# Patient Record
Sex: Male | Born: 1985 | Hispanic: Yes | Smoking: Never smoker
Health system: Southern US, Community
[De-identification: ages and names within clinical notes are randomized; demographics above are authoritative.]

---

## 2020-07-07 ENCOUNTER — Emergency Department (HOSPITAL_COMMUNITY)
Admission: EM | Admit: 2020-07-07 | Discharge: 2020-07-07 | Disposition: A | Discharge status: Home / Self Care | Payer: Self-pay | Attending: Emergency Medicine | Admitting: Emergency Medicine

## 2020-07-07 ENCOUNTER — Emergency Department (HOSPITAL_COMMUNITY): Payer: Self-pay

## 2020-07-07 ENCOUNTER — Encounter (HOSPITAL_COMMUNITY): Payer: Self-pay | Admitting: Emergency Medicine

## 2020-07-07 ENCOUNTER — Other Ambulatory Visit: Payer: Self-pay

## 2020-07-07 DIAGNOSIS — R5383 Other fatigue: Secondary | ICD-10-CM | POA: Insufficient documentation

## 2020-07-07 DIAGNOSIS — R1013 Epigastric pain: Secondary | ICD-10-CM | POA: Insufficient documentation

## 2020-07-07 DIAGNOSIS — R051 Acute cough: Secondary | ICD-10-CM | POA: Insufficient documentation

## 2020-07-07 DIAGNOSIS — Z20822 Contact with and (suspected) exposure to covid-19: Secondary | ICD-10-CM | POA: Insufficient documentation

## 2020-07-07 DIAGNOSIS — R509 Fever, unspecified: Secondary | ICD-10-CM | POA: Insufficient documentation

## 2020-07-07 LAB — COMPREHENSIVE METABOLIC PANEL
ALT: 45 U/L — ABNORMAL HIGH (ref 0–44)
AST: 23 U/L (ref 15–41)
Albumin: 4.1 g/dL (ref 3.5–5.0)
Alkaline Phosphatase: 66 U/L (ref 38–126)
Anion gap: 12 (ref 5–15)
BUN: 16 mg/dL (ref 6–20)
CO2: 22 mmol/L (ref 22–32)
Calcium: 9.3 mg/dL (ref 8.9–10.3)
Chloride: 106 mmol/L (ref 98–111)
Creatinine, Ser: 0.83 mg/dL (ref 0.61–1.24)
GFR, Estimated: >60 mL/min (ref >60)
Glucose, Bld: 120 mg/dL — ABNORMAL HIGH (ref 70–99)
Potassium: 3.9 mmol/L (ref 3.5–5.1)
Sodium: 140 mmol/L (ref 135–145)
Total Bilirubin: 0.2 mg/dL — ABNORMAL LOW (ref 0.3–1.2)
Total Protein: 7.2 g/dL (ref 6.5–8.1)

## 2020-07-07 LAB — CBC WITH DIFFERENTIAL/PLATELET
Abs Immature Granulocytes: 0.06 10*3/uL (ref 0.00–0.07)
Basophils Absolute: 0.1 10*3/uL (ref 0.0–0.1)
Basophils Relative: 0 %
Eosinophils Absolute: 0.1 10*3/uL (ref 0.0–0.5)
Eosinophils Relative: 1 %
HCT: 47 % (ref 39.0–52.0)
Hemoglobin: 15.9 g/dL (ref 13.0–17.0)
Immature Granulocytes: 1 %
Lymphocytes Relative: 28 %
Lymphs Abs: 3.4 10*3/uL (ref 0.7–4.0)
MCH: 29.3 pg (ref 26.0–34.0)
MCHC: 33.8 g/dL (ref 30.0–36.0)
MCV: 86.6 fL (ref 80.0–100.0)
Monocytes Absolute: 0.9 10*3/uL (ref 0.1–1.0)
Monocytes Relative: 7 %
Neutro Abs: 7.6 10*3/uL (ref 1.7–7.7)
Neutrophils Relative %: 63 %
Platelets: 311 10*3/uL (ref 150–400)
RBC: 5.43 MIL/uL (ref 4.22–5.81)
RDW: 14.4 % (ref 11.5–15.5)
WBC: 12.1 10*3/uL — ABNORMAL HIGH (ref 4.0–10.5)
nRBC: 0 % (ref 0.0–0.2)

## 2020-07-07 LAB — RESPIRATORY PANEL BY RT PCR (FLU A&B, COVID)
Influenza A by PCR: NEGATIVE
Influenza B by PCR: NEGATIVE
SARS Coronavirus 2 by RT PCR: NEGATIVE

## 2020-07-07 LAB — URINALYSIS, ROUTINE W REFLEX MICROSCOPIC
Bilirubin Urine: NEGATIVE
Glucose, UA: NEGATIVE mg/dL
Hgb urine dipstick: NEGATIVE
Ketones, ur: NEGATIVE mg/dL
Leukocytes,Ua: NEGATIVE
Nitrite: NEGATIVE
Protein, ur: NEGATIVE mg/dL
Specific Gravity, Urine: 1.018 (ref 1.005–1.030)
pH: 5 (ref 5.0–8.0)

## 2020-07-07 LAB — LACTIC ACID, PLASMA
Lactic Acid, Venous: 2.3 mmol/L (ref 0.5–1.9)
Lactic Acid, Venous: 1.3 mmol/L (ref 0.5–1.9)

## 2020-07-07 LAB — LIPASE, BLOOD: Lipase: 29 U/L (ref 11–51)

## 2020-07-07 MED ORDER — SODIUM CHLORIDE 0.9 % IV BOLUS
1000.0000 mL | Freq: Once | INTRAVENOUS | Status: AC
  Administered 2020-07-07: 1000 mL via INTRAVENOUS

## 2020-07-07 MED ORDER — SODIUM CHLORIDE (PF) 0.9 % IJ SOLN
INTRAMUSCULAR | Status: AC
  Filled 2020-07-07: qty 50

## 2020-07-07 MED ORDER — IOHEXOL 300 MG/ML  SOLN
100.0000 mL | Freq: Once | INTRAMUSCULAR | Status: AC | PRN
  Administered 2020-07-07: 100 mL via INTRAVENOUS

## 2020-07-07 NOTE — ED Provider Notes (Signed)
[facility]-EMERGENCY DEPT Provider Note   CSN: [id number] Arrival date & time: 07/07/20  1791     History Chief Complaint  Patient presents with  . Cough  . Fever    Joseph Taylor is a 34 y.o. male who presents for evaluation of body aches, fatigue.  He states he has also felt like he has had fever. He has also felt like he has had fever.  He states that this been going on for about 9 days.  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  He said that he used to snort heroin.  He denied any IV drug use at that time.  He states his last IV drug use was about 6 months ago.  He states that since he got clean about a month ago, he is had trouble sleeping has had some pain in his lower back, particularly the left side and has had some pain in his upper abdomen.  He states that the pain happens both when he does not eat and when he does eat.  He has not any nausea/vomiting.  He has not had any dysuria or hematuria.  He states that the pain in his back is worse when he moves.  He states he has not measured a fever over the last week but states he has felt like he has had a fever.  He states he went to an ER in [place] and that he was evaluated there.  He does not know exactly what they did for him.  He states he was discharged home with some medications but does not know what it was.  He states that he did not take them. Denies fevers, weight loss, numbness/weakness of upper and lower extremities, bowel/bladder incontinence, saddle anesthesia, history of back surgery.   The history is provided by the patient.       History reviewed. No pertinent past medical history.  There are no problems to display for this patient.   History reviewed. No pertinent surgical history.     History reviewed. No pertinent family history.  Social History   Tobacco Use  . Smoking status: Never Smoker  . Smokeless tobacco: Never Used  Vaping Use  . Vaping Use: Never  used  Substance Use Topics  . Alcohol use: Not Currently  . Drug use: Not Currently    Home Medications Prior to Admission medications   Not on File    Allergies    Patient has no known allergies.  Review of Systems   Review of Systems  Constitutional: Positive for fatigue. Negative for fever.  Respiratory: Negative for cough and shortness of breath.   Cardiovascular: Negative for chest pain.  Gastrointestinal: Positive for abdominal pain. Negative for nausea and vomiting.  Genitourinary: Negative for dysuria and hematuria.  Musculoskeletal: Positive for back pain.  Neurological: Negative for headaches.  All other systems reviewed and are negative.   Physical Exam Updated Vital Signs BP 123/76 (BP Location: Left Arm)   Pulse 97   Temp 99.1 F (37.3 C) (Rectal)   Resp 16   Ht 5\' 6"  (1.676 m)   Wt 86.2 kg   SpO2 99%   BMI 30.67 kg/m   Physical Exam Vitals and nursing note reviewed.  Constitutional:      Appearance: Normal appearance. He is well-developed.  HENT:     Head: Normocephalic and atraumatic.  Eyes:     General: Lids are normal.     Conjunctiva/sclera: Conjunctivae  normal.     Pupils: Pupils are equal, round, and reactive to light.  Cardiovascular:     Rate and Rhythm: Normal rate and regular rhythm.     Pulses: Normal pulses.     Heart sounds: Normal heart sounds. No murmur heard.  No friction rub. No gallop.   Pulmonary:     Effort: Pulmonary effort is normal.     Breath sounds: Normal breath sounds.     Comments: Lungs clear to auscultation bilaterally.  Symmetric chest rise.  No wheezing, rales, rhonchi. Abdominal:     Palpations: Abdomen is soft. Abdomen is not rigid.     Tenderness: There is abdominal tenderness in the right upper quadrant, epigastric area and left upper quadrant. There is no guarding.     Comments: Mild tenderness noted to the upper abdomen. No rigidity or guarding noted.   Musculoskeletal:        General: Normal range of  motion.     Cervical back: Full passive range of motion without pain.     Comments: No midline T or L-spine tenderness.  No deformity or crepitus noted.  He has tenderness noted to the left paraspinal muscles of the lower lumbar region.  No overlying rash.  Skin:    General: Skin is warm and dry.     Capillary Refill: Capillary refill takes less than 2 seconds.  Neurological:     Mental Status: He is alert and oriented to person, place, and time.     Comments: Follows commands, Moves all extremities  5/5 strength to BUE and BLE  Sensation intact throughout all major nerve distributions  Psychiatric:        Speech: Speech normal.     ED Results / Procedures / Treatments   Labs (all labs ordered are listed, but only abnormal results are displayed) Labs Reviewed  COMPREHENSIVE METABOLIC PANEL - Abnormal; Notable for the following components:      Result Value   Glucose, Bld 120 (*)    ALT 45 (*)    Total Bilirubin 0.2 (*)    All other components within normal limits  CBC WITH DIFFERENTIAL/PLATELET - Abnormal; Notable for the following components:   WBC 12.1 (*)    All other components within normal limits  LACTIC ACID, PLASMA - Abnormal; Notable for the following components:   Lactic Acid, Venous 2.3 (*)    All other components within normal limits  RESPIRATORY PANEL BY RT PCR (FLU A&B, COVID)  URINALYSIS, ROUTINE W REFLEX MICROSCOPIC  LACTIC ACID, PLASMA  LIPASE, BLOOD    EKG None  Radiology DG Chest 2 View  Result Date: 07/07/2020 CLINICAL DATA:  Cough. Fever and aches for 1.5 months after discontinuing heroin. EXAM: CHEST - 2 VIEW COMPARISON:  None. FINDINGS: The heart size and mediastinal contours are within normal limits. Both lungs are clear. The visualized skeletal structures are unremarkable. IMPRESSION: No active cardiopulmonary disease. Electronically Signed   By: Burman Nieves M.D.   On: 07/07/2020 20:24   CT ABDOMEN PELVIS W CONTRAST  Result Date:  07/07/2020 CLINICAL DATA:  Fever and lower abdominal pain EXAM: CT ABDOMEN AND PELVIS WITH CONTRAST TECHNIQUE: Multidetector CT imaging of the abdomen and pelvis was performed using the standard protocol following bolus administration of intravenous contrast. CONTRAST:  OMNIPAQUE IOHEXOL 300 MG/ML  SOLN COMPARISON:  None. FINDINGS: Lower chest: The visualized heart size within normal limits. No pericardial fluid/thickening. No hiatal hernia. The visualized portions of the lungs are clear. Hepatobiliary: The liver  is normal in density without focal abnormality.The main portal vein is patent. No evidence of calcified gallstones, gallbladder wall thickening or biliary dilatation. Pancreas: Unremarkable. No pancreatic ductal dilatation or surrounding inflammatory changes. Spleen: Normal in size without focal abnormality. Adrenals/Urinary Tract: Both adrenal glands appear normal. The kidneys and collecting system appear normal without evidence of urinary tract calculus or hydronephrosis. Bladder is unremarkable. Stomach/Bowel: The stomach, small bowel, and colon are normal in appearance. No inflammatory changes, wall thickening, or obstructive findings. There is a moderate amount of stool present.The appendix is normal. Vascular/Lymphatic: There are no enlarged mesenteric, retroperitoneal, or pelvic lymph nodes. Scattered atherosclerosis seen in the left common iliac vasculature. Reproductive: The prostate is unremarkable. Other: No evidence of abdominal wall mass or hernia. Musculoskeletal: No acute or significant osseous findings. IMPRESSION: No acute intra-abdominal or pelvic pathology to explain the patient's symptoms. Moderate amount of colonic stool. Electronically Signed   By: Jonna Clark M.D.   On: 07/07/2020 22:13    Procedures Procedures (including critical care time)  Medications Ordered in ED Medications  sodium chloride (PF) 0.9 % injection (has no administration in time range)  sodium  chloride 0.9 % bolus 1,000 mL (0 mLs Intravenous Stopped 07/07/20 2120)  sodium chloride 0.9 % bolus 1,000 mL (0 mLs Intravenous Stopped 07/07/20 2317)  iohexol (OMNIPAQUE) 300 MG/ML solution 100 mL (100 mLs Intravenous Contrast Given 07/07/20 2158)    ED Course  I have reviewed the triage vital signs and the nursing notes.  Pertinent labs & imaging results that were available during my care of the patient were reviewed by me and considered in my medical decision making (see chart for details).  Clinical Course as of Jul 08 45  Fri Jul 07, 2020  2312 CT ABDOMEN PELVIS W CONTRAST [KK]    Clinical Course User Index [KK] Klett, Sherilyn Dacosta   MDM Rules/Calculators/A&P                          34 year old male who presents for evaluation of multiple complaints.  He states that he has felt bad for about 9 days.  He states he has felt he had a fever at home but has not actually measured a fever.  He states that he has felt tired and fatigued.  He states he has had some chills as well.  Also reports he has had some back pain and stomach pain that has been ongoing for about a month.  He states he has felt those pain since he had been going through detox and got off of heroin.  He reports that he used to snort heroin.  He has not used since last month.  He has a history of IV drug use but states he was 6 years ago.  He denies any urinary or bowel incontinence, numbness/weakness of his arms or legs.  On initial arrival, he is afebrile nontoxic-appearing.  Vital signs are stable.  On exam, he does have some tenderness in his upper abdomen as well as some point tenderness noted to the left paraspinal muscles.  My suspicion for acute intra-abdominal process is low given that this is been an ongoing thing for several weeks.  Question if this is some gastritis versus PUD.  No midline lumbar tenderness.  Suspect this is musculoskeletal.  History/physical exam not concerning for cauda equina, spinal  abscess.  We will plan to check labs.  Will also obtain Covid test.  CBC shows slight leukocytosis  12.1.  Lipase normal.  CMP shows normal-appearing creatinine.  Lactic slightly elevated 2.3.  Suspect is likely from dehydration rather than infectious process.  UA negative for any infection.  Covid is negative.  Chest x-ray is unremarkable.  CT abd/pelvis shows no evidence of acute abnormalities.  RN informing that patient did not want to stay for his repeat lactic.  I discussed with patient.  He sitting comfortably in chair with no signs of distress.  Patient states he would like to go home.  His vitals are stable.  I discussed with his lactic pending.  He states he does not want to wait for it and would rather go home. At this time, patient exhibits no emergent life-threatening condition that require further evaluation in ED. Patient had ample opportunity for questions and discussion. All patient's questions were answered with full understanding. Strict return precautions discussed. Patient expresses understanding and agreement to plan.   Portions of this note were generated with Scientist, clinical (histocompatibility and immunogenetics)Dragon dictation software. Dictation errors may occur despite best attempts at proofreading.  Final Clinical Impression(s) / ED Diagnoses Final diagnoses:  Other fatigue    Rx / DC Orders ED Discharge Orders    None       Maxwell CaulLayden, Marcella Charlson A, PA-C 07/08/20 0046    Bethann BerkshireZammit, Joseph, MD 07/18/20 1538

## 2020-07-07 NOTE — Discharge Instructions (Signed)
As we discussed today, your work-up today was reassuring.  Your CT scan did not show any abnormalities.  As we discussed, you have 1 more lab test that is pending but you decided to leave before it is back.  Follow-up with [facility].  Return emergency department for any fever, abdominal pain, vomiting or any other worsening concerning symptoms.

## 2020-07-07 NOTE — ED Triage Notes (Signed)
Patient complaining of feeling bad for 9 days. Patient states that he had a fever. Patient has been drug free for about 2 months. Patient was also complaining of lower back pain.

## 2021-10-30 IMAGING — CR DG CHEST 2V
2 series · 2 of 2 positions shown · non-contrast
Comparison: None.

CLINICAL DATA: Cough. Fever and aches for 1.5 months after
discontinuing heroin.

EXAM:
CHEST - 2 VIEW

[w chest pa]
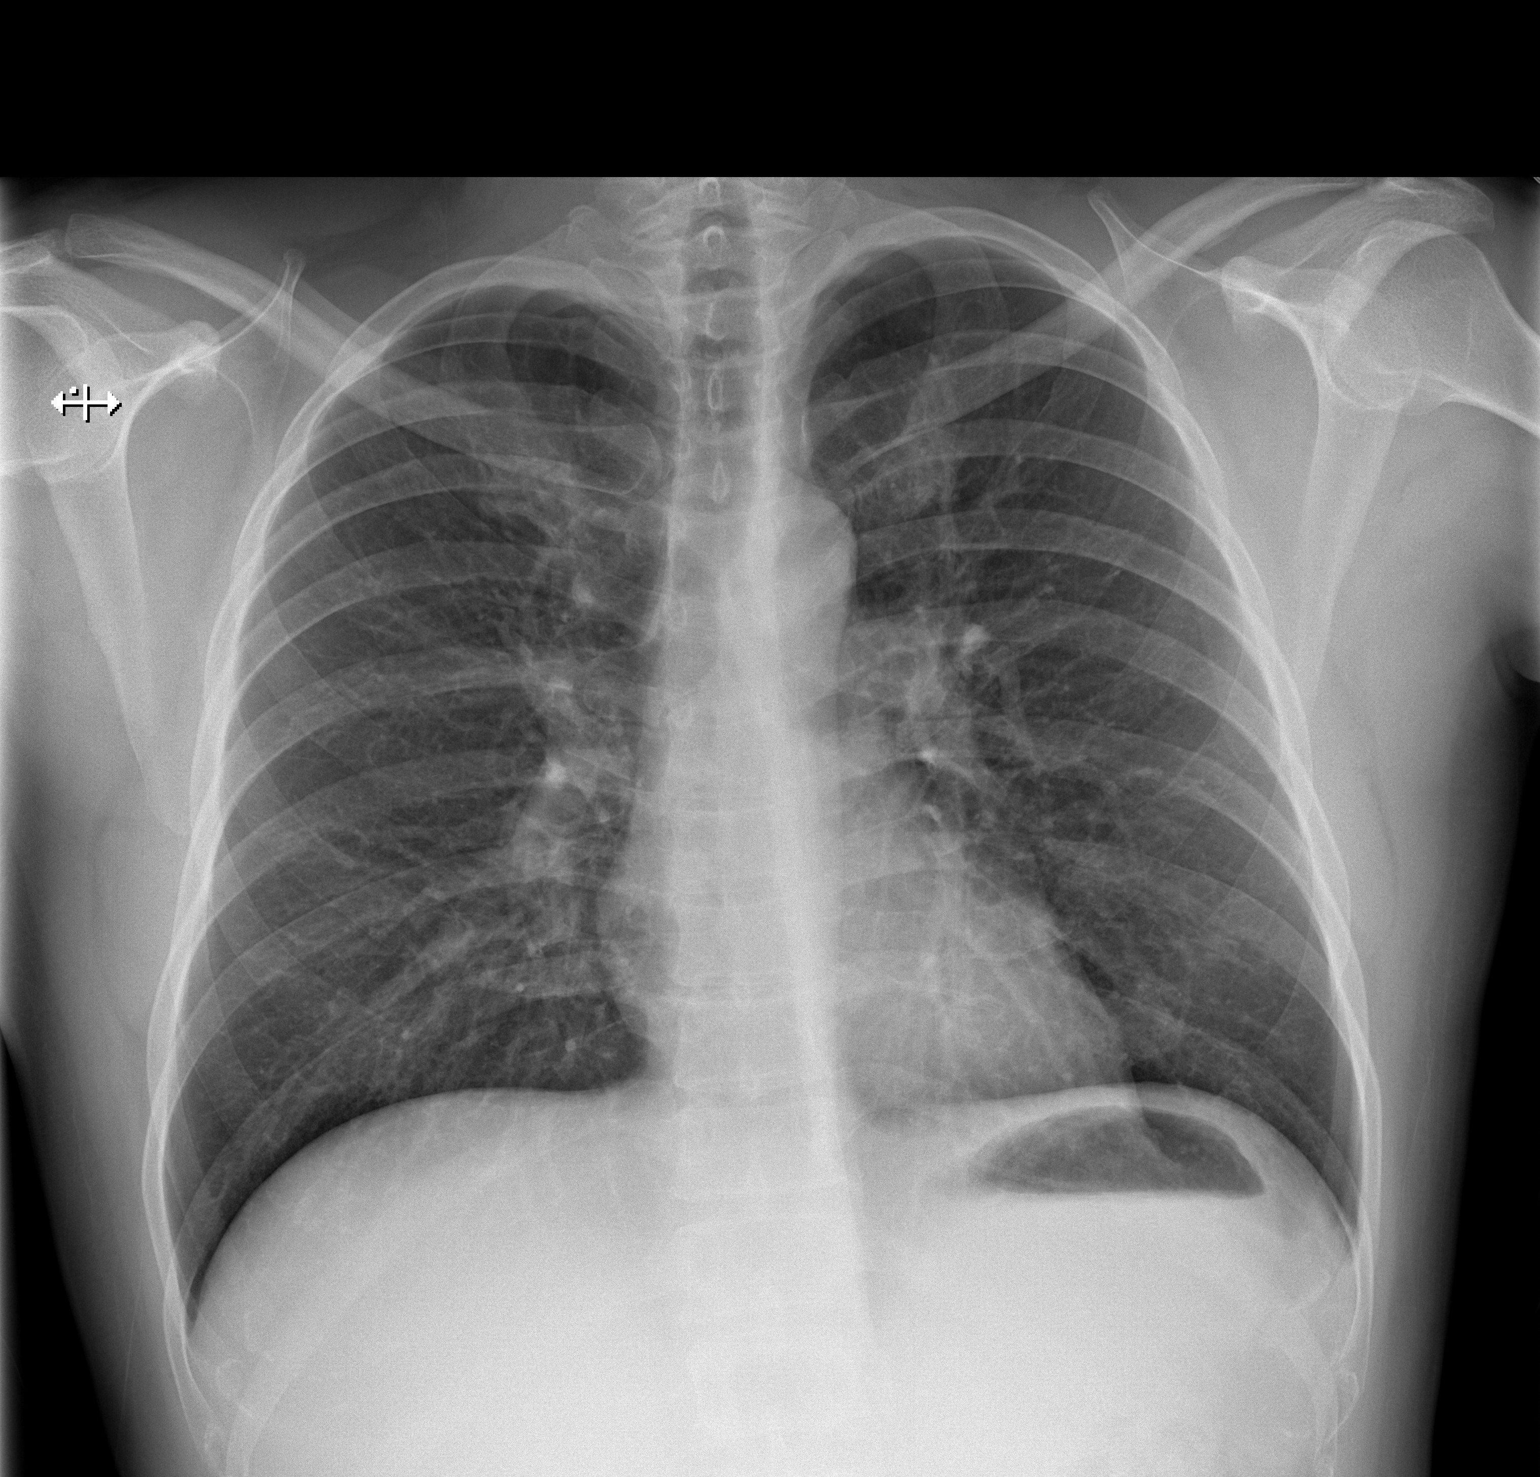

[w chest lat]
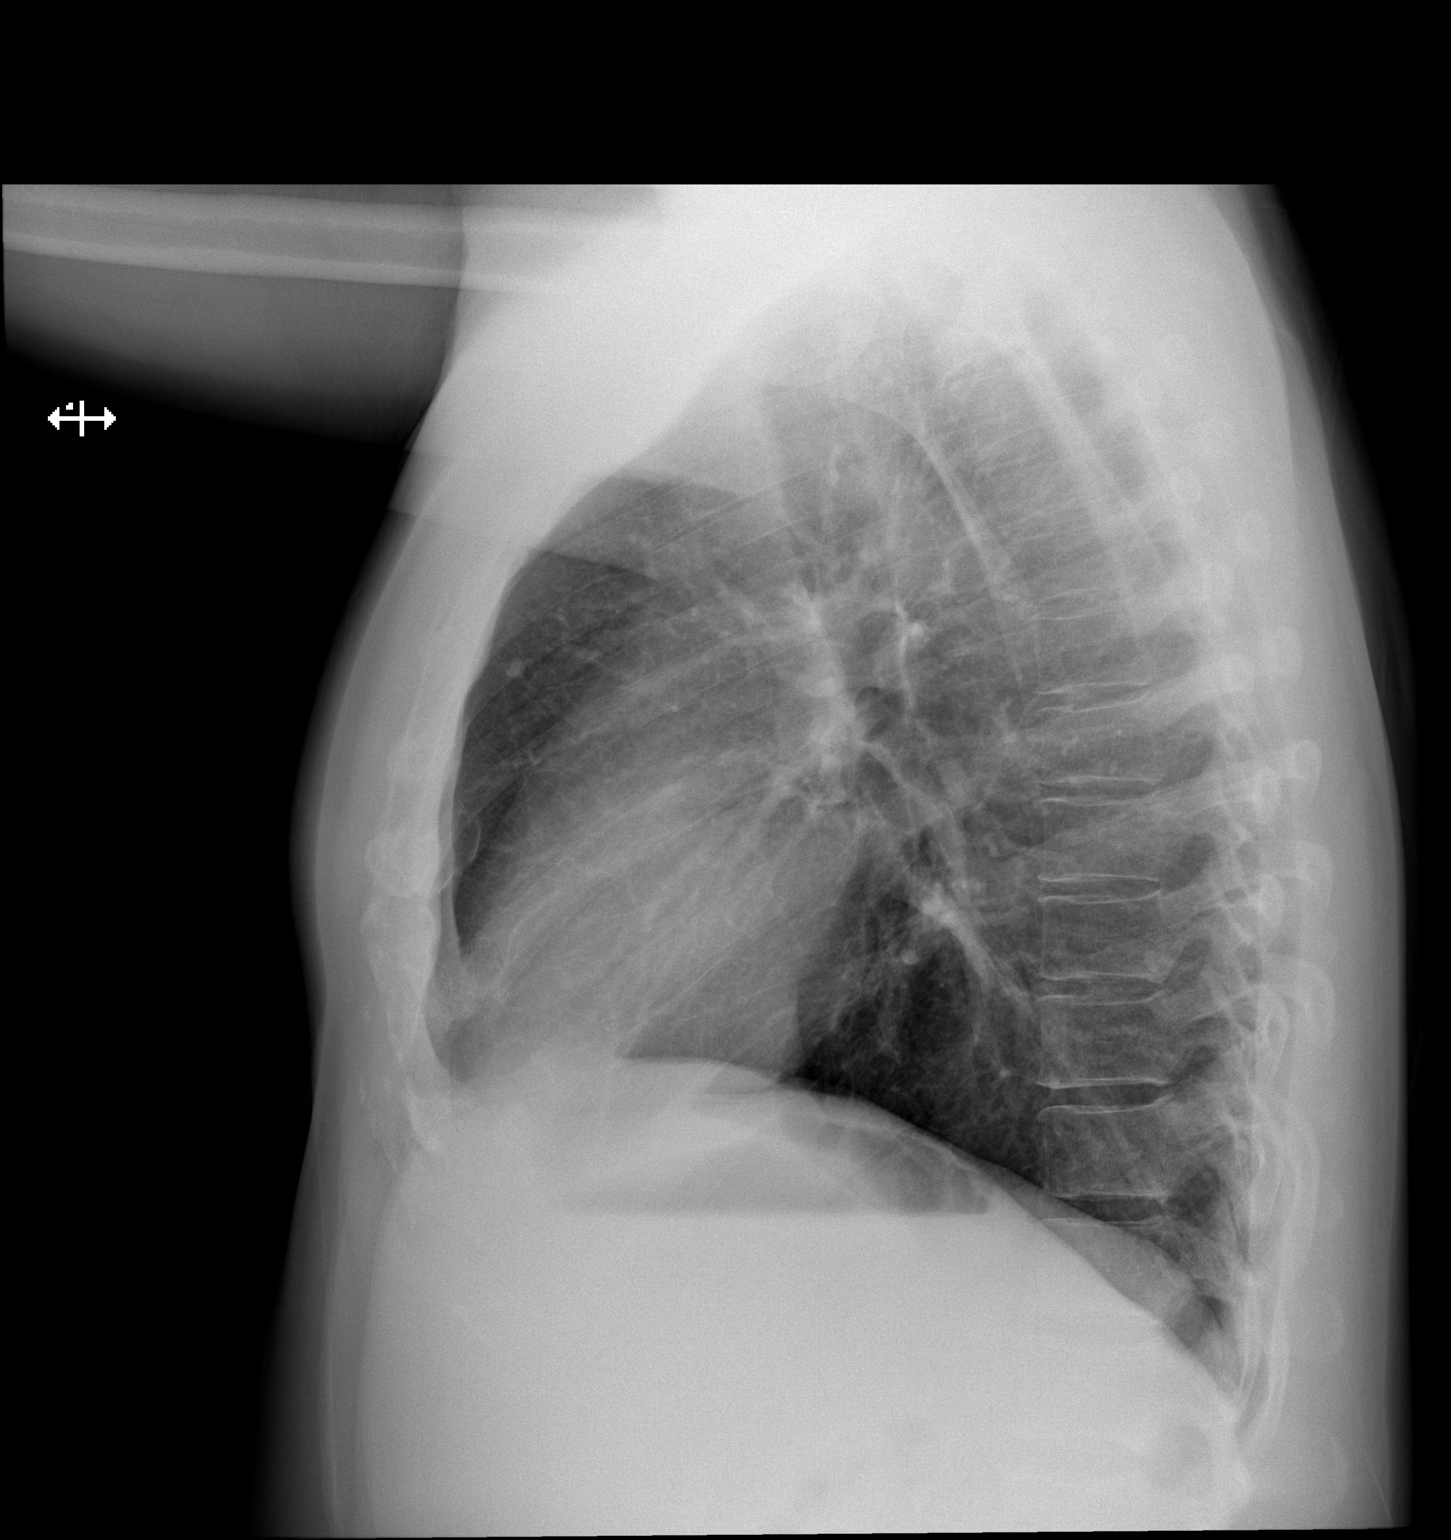

[2 of 2 positions shown; findings below may reference images not displayed]

FINDINGS: The heart size and mediastinal contours are within normal limits.
Both lungs are clear. The visualized skeletal structures are
unremarkable.
IMPRESSION: No active cardiopulmonary disease.
